# Patient Record
Sex: Female | Born: 2000 | Race: White | Hispanic: No | Marital: Single | Smoking: Never smoker
Health system: Southern US, Community
[De-identification: ages and names within clinical notes are randomized; demographics above are authoritative.]

---
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
## 2019-05-31 DIAGNOSIS — Z1331 Encounter for screening for depression: Secondary | ICD-10-CM | POA: Diagnosis not present

## 2019-05-31 DIAGNOSIS — Z0001 Encounter for general adult medical examination with abnormal findings: Secondary | ICD-10-CM | POA: Diagnosis not present

## 2019-05-31 DIAGNOSIS — Z13220 Encounter for screening for lipoid disorders: Secondary | ICD-10-CM | POA: Diagnosis not present

## 2019-05-31 DIAGNOSIS — Z23 Encounter for immunization: Secondary | ICD-10-CM | POA: Diagnosis not present

## 2019-05-31 DIAGNOSIS — Z713 Dietary counseling and surveillance: Secondary | ICD-10-CM | POA: Diagnosis not present

## 2019-05-31 DIAGNOSIS — K219 Gastro-esophageal reflux disease without esophagitis: Secondary | ICD-10-CM | POA: Diagnosis not present

## 2019-06-22 DIAGNOSIS — L700 Acne vulgaris: Secondary | ICD-10-CM | POA: Diagnosis not present

## 2019-08-04 DIAGNOSIS — J029 Acute pharyngitis, unspecified: Secondary | ICD-10-CM | POA: Diagnosis not present

## 2019-08-04 DIAGNOSIS — J039 Acute tonsillitis, unspecified: Secondary | ICD-10-CM | POA: Diagnosis not present

## 2019-09-15 DIAGNOSIS — J029 Acute pharyngitis, unspecified: Secondary | ICD-10-CM | POA: Diagnosis not present

## 2019-09-15 DIAGNOSIS — J039 Acute tonsillitis, unspecified: Secondary | ICD-10-CM | POA: Diagnosis not present

## 2019-10-28 ENCOUNTER — Ambulatory Visit: Payer: Self-pay | Attending: Internal Medicine

## 2019-10-28 DIAGNOSIS — Z23 Encounter for immunization: Secondary | ICD-10-CM

## 2019-10-28 NOTE — Progress Notes (Signed)
   Covid-19 Vaccination Clinic  Name:  Nalaysia Manganiello    MRN: [id number] DOB: 07-25-2000  10/28/2019  Ms. Mckendry was observed post Covid-19 immunization for 15 minutes without incident. She was provided with Vaccine Information Sheet and instruction to access the V-Safe system.   Ms. Bencosme was instructed to call 911 with any severe reactions post vaccine: Marland Kitchen Difficulty breathing  . Swelling of face and throat  . A fast heartbeat  . A bad rash all over body  . Dizziness and weakness   Immunizations Administered    Name Date Dose VIS Date Route   Pfizer COVID-19 Vaccine 10/28/2019 10:15 AM 0.3 mL 08/18/2018 Intramuscular   Manufacturer: [employer], [employer]   Lot: [id number]   NDC: [address]-2

## 2019-11-16 DIAGNOSIS — R454 Irritability and anger: Secondary | ICD-10-CM | POA: Diagnosis not present

## 2019-11-16 DIAGNOSIS — R452 Unhappiness: Secondary | ICD-10-CM | POA: Diagnosis not present

## 2019-11-16 DIAGNOSIS — R453 Demoralization and apathy: Secondary | ICD-10-CM | POA: Diagnosis not present

## 2019-11-16 DIAGNOSIS — R450 Nervousness: Secondary | ICD-10-CM | POA: Diagnosis not present

## 2019-11-23 ENCOUNTER — Ambulatory Visit: Payer: Self-pay | Attending: Internal Medicine

## 2019-11-23 DIAGNOSIS — Z23 Encounter for immunization: Secondary | ICD-10-CM

## 2019-11-23 DIAGNOSIS — F4323 Adjustment disorder with mixed anxiety and depressed mood: Secondary | ICD-10-CM | POA: Diagnosis not present

## 2019-11-23 NOTE — Progress Notes (Signed)
° °  Covid-19 Vaccination Clinic  Name:  Megan Le    MRN: [id number] DOB: [birth year]  11/23/2019  Megan Le was observed post Covid-19 immunization for 15 minutes without incident. She was provided with Vaccine Information Sheet and instruction to access the V-Safe system.   Megan Le was instructed to call 911 with any severe reactions post vaccine:  Difficulty breathing   Swelling of face and throat   A fast heartbeat   A bad rash all over body   Dizziness and weakness   Immunizations Administered    Name Date Dose VIS Date Route   Pfizer COVID-19 Vaccine 11/23/2019  9:50 AM 0.3 mL 08/18/2018 Intramuscular   Manufacturer: [employer], [employer]   Lot: [id number]   NDC: [address]-3

## 2019-12-08 DIAGNOSIS — R450 Nervousness: Secondary | ICD-10-CM | POA: Diagnosis not present

## 2019-12-08 DIAGNOSIS — F329 Major depressive disorder, single episode, unspecified: Secondary | ICD-10-CM | POA: Diagnosis not present

## 2019-12-08 DIAGNOSIS — F419 Anxiety disorder, unspecified: Secondary | ICD-10-CM | POA: Diagnosis not present

## 2019-12-09 DIAGNOSIS — F411 Generalized anxiety disorder: Secondary | ICD-10-CM | POA: Diagnosis not present

## 2019-12-20 DIAGNOSIS — F4323 Adjustment disorder with mixed anxiety and depressed mood: Secondary | ICD-10-CM | POA: Diagnosis not present

## 2020-01-04 DIAGNOSIS — F4323 Adjustment disorder with mixed anxiety and depressed mood: Secondary | ICD-10-CM | POA: Diagnosis not present

## 2020-01-17 DIAGNOSIS — F4323 Adjustment disorder with mixed anxiety and depressed mood: Secondary | ICD-10-CM | POA: Diagnosis not present

## 2020-01-19 DIAGNOSIS — F411 Generalized anxiety disorder: Secondary | ICD-10-CM | POA: Diagnosis not present

## 2020-03-29 DIAGNOSIS — J029 Acute pharyngitis, unspecified: Secondary | ICD-10-CM | POA: Diagnosis not present

## 2020-03-29 DIAGNOSIS — J019 Acute sinusitis, unspecified: Secondary | ICD-10-CM | POA: Diagnosis not present

## 2020-05-11 DIAGNOSIS — N390 Urinary tract infection, site not specified: Secondary | ICD-10-CM | POA: Diagnosis not present

## 2020-05-11 DIAGNOSIS — J019 Acute sinusitis, unspecified: Secondary | ICD-10-CM | POA: Diagnosis not present

## 2020-05-24 DIAGNOSIS — J209 Acute bronchitis, unspecified: Secondary | ICD-10-CM | POA: Diagnosis not present

## 2020-05-24 DIAGNOSIS — J019 Acute sinusitis, unspecified: Secondary | ICD-10-CM | POA: Diagnosis not present

## 2020-06-12 DIAGNOSIS — F419 Anxiety disorder, unspecified: Secondary | ICD-10-CM | POA: Diagnosis not present

## 2020-06-12 DIAGNOSIS — Z0001 Encounter for general adult medical examination with abnormal findings: Secondary | ICD-10-CM | POA: Diagnosis not present

## 2020-06-12 DIAGNOSIS — Z1331 Encounter for screening for depression: Secondary | ICD-10-CM | POA: Diagnosis not present

## 2020-06-12 DIAGNOSIS — Z113 Encounter for screening for infections with a predominantly sexual mode of transmission: Secondary | ICD-10-CM | POA: Diagnosis not present

## 2020-06-12 DIAGNOSIS — R109 Unspecified abdominal pain: Secondary | ICD-10-CM | POA: Diagnosis not present

## 2020-06-12 DIAGNOSIS — L209 Atopic dermatitis, unspecified: Secondary | ICD-10-CM | POA: Diagnosis not present

## 2020-06-12 DIAGNOSIS — Z0000 Encounter for general adult medical examination without abnormal findings: Secondary | ICD-10-CM | POA: Diagnosis not present

## 2020-06-12 DIAGNOSIS — Z23 Encounter for immunization: Secondary | ICD-10-CM | POA: Diagnosis not present

## 2020-06-28 DIAGNOSIS — F419 Anxiety disorder, unspecified: Secondary | ICD-10-CM | POA: Diagnosis not present

## 2020-06-28 DIAGNOSIS — F329 Major depressive disorder, single episode, unspecified: Secondary | ICD-10-CM | POA: Diagnosis not present

## 2020-09-12 DIAGNOSIS — L309 Dermatitis, unspecified: Secondary | ICD-10-CM | POA: Diagnosis not present

## 2020-09-12 DIAGNOSIS — B86 Scabies: Secondary | ICD-10-CM | POA: Diagnosis not present

## 2021-01-26 DIAGNOSIS — J029 Acute pharyngitis, unspecified: Secondary | ICD-10-CM | POA: Diagnosis not present

## 2021-01-26 DIAGNOSIS — R059 Cough, unspecified: Secondary | ICD-10-CM | POA: Diagnosis not present

## 2021-01-26 DIAGNOSIS — Z20822 Contact with and (suspected) exposure to covid-19: Secondary | ICD-10-CM | POA: Diagnosis not present

## 2021-02-27 DIAGNOSIS — Z20828 Contact with and (suspected) exposure to other viral communicable diseases: Secondary | ICD-10-CM | POA: Diagnosis not present

## 2021-02-27 DIAGNOSIS — R197 Diarrhea, unspecified: Secondary | ICD-10-CM | POA: Diagnosis not present

## 2021-02-27 DIAGNOSIS — J0300 Acute streptococcal tonsillitis, unspecified: Secondary | ICD-10-CM | POA: Diagnosis not present

## 2021-02-27 DIAGNOSIS — J019 Acute sinusitis, unspecified: Secondary | ICD-10-CM | POA: Diagnosis not present

## 2021-04-18 DIAGNOSIS — J019 Acute sinusitis, unspecified: Secondary | ICD-10-CM | POA: Diagnosis not present

## 2021-04-18 DIAGNOSIS — J0300 Acute streptococcal tonsillitis, unspecified: Secondary | ICD-10-CM | POA: Diagnosis not present

## 2021-04-18 DIAGNOSIS — Z20822 Contact with and (suspected) exposure to covid-19: Secondary | ICD-10-CM | POA: Diagnosis not present

## 2021-04-18 DIAGNOSIS — J111 Influenza due to unidentified influenza virus with other respiratory manifestations: Secondary | ICD-10-CM | POA: Diagnosis not present

## 2021-11-05 DIAGNOSIS — J111 Influenza due to unidentified influenza virus with other respiratory manifestations: Secondary | ICD-10-CM | POA: Diagnosis not present

## 2021-11-05 DIAGNOSIS — Z20828 Contact with and (suspected) exposure to other viral communicable diseases: Secondary | ICD-10-CM | POA: Diagnosis not present

## 2021-11-05 DIAGNOSIS — J019 Acute sinusitis, unspecified: Secondary | ICD-10-CM | POA: Diagnosis not present

## 2021-11-05 DIAGNOSIS — J0300 Acute streptococcal tonsillitis, unspecified: Secondary | ICD-10-CM | POA: Diagnosis not present

## 2022-03-04 DIAGNOSIS — L858 Other specified epidermal thickening: Secondary | ICD-10-CM | POA: Diagnosis not present

## 2022-03-04 DIAGNOSIS — D224 Melanocytic nevi of scalp and neck: Secondary | ICD-10-CM | POA: Diagnosis not present

## 2022-03-04 DIAGNOSIS — D2239 Melanocytic nevi of other parts of face: Secondary | ICD-10-CM | POA: Diagnosis not present

## 2022-03-04 DIAGNOSIS — D225 Melanocytic nevi of trunk: Secondary | ICD-10-CM | POA: Diagnosis not present

## 2022-05-23 DIAGNOSIS — J018 Other acute sinusitis: Secondary | ICD-10-CM | POA: Diagnosis not present

## 2022-05-23 DIAGNOSIS — R0982 Postnasal drip: Secondary | ICD-10-CM | POA: Diagnosis not present

## 2022-05-23 DIAGNOSIS — J028 Acute pharyngitis due to other specified organisms: Secondary | ICD-10-CM | POA: Diagnosis not present

## 2022-05-23 DIAGNOSIS — J069 Acute upper respiratory infection, unspecified: Secondary | ICD-10-CM | POA: Diagnosis not present

## 2022-05-23 DIAGNOSIS — R0981 Nasal congestion: Secondary | ICD-10-CM | POA: Diagnosis not present

## 2022-06-10 DIAGNOSIS — R0981 Nasal congestion: Secondary | ICD-10-CM | POA: Diagnosis not present

## 2022-06-10 DIAGNOSIS — J069 Acute upper respiratory infection, unspecified: Secondary | ICD-10-CM | POA: Diagnosis not present

## 2022-06-10 DIAGNOSIS — R059 Cough, unspecified: Secondary | ICD-10-CM | POA: Diagnosis not present

## 2022-06-10 DIAGNOSIS — J029 Acute pharyngitis, unspecified: Secondary | ICD-10-CM | POA: Diagnosis not present
